# Patient Record
Sex: Female | Born: 1940 | Race: Black or African American | Hispanic: No | Marital: Married
Health system: Southern US, Community
[De-identification: ages and names within clinical notes are randomized; demographics above are authoritative.]

---

## 2005-10-30 ENCOUNTER — Emergency Department (HOSPITAL_COMMUNITY): Admission: EM | Admit: 2005-10-30 | Discharge: 2005-10-30 | Payer: Self-pay | Admitting: Emergency Medicine

## 2006-06-19 ENCOUNTER — Emergency Department (HOSPITAL_COMMUNITY): Admission: EM | Admit: 2006-06-19 | Discharge: 2006-06-19 | Payer: Self-pay | Admitting: Emergency Medicine

## 2010-07-29 ENCOUNTER — Encounter: Admission: RE | Admit: 2010-07-29 | Discharge: 2010-07-29 | Payer: Self-pay | Admitting: Internal Medicine

## 2011-10-11 ENCOUNTER — Emergency Department (HOSPITAL_COMMUNITY): Payer: Medicare Other

## 2011-10-11 ENCOUNTER — Emergency Department (HOSPITAL_COMMUNITY)
Admission: EM | Admit: 2011-10-11 | Discharge: 2011-10-11 | Disposition: A | Discharge status: Home / Self Care | Payer: Medicare Other | Attending: Emergency Medicine | Admitting: Emergency Medicine

## 2011-10-11 DIAGNOSIS — I10 Essential (primary) hypertension: Secondary | ICD-10-CM | POA: Insufficient documentation

## 2011-10-11 DIAGNOSIS — Z79899 Other long term (current) drug therapy: Secondary | ICD-10-CM | POA: Insufficient documentation

## 2011-10-11 DIAGNOSIS — R609 Edema, unspecified: Secondary | ICD-10-CM | POA: Insufficient documentation

## 2011-10-11 DIAGNOSIS — R1011 Right upper quadrant pain: Secondary | ICD-10-CM | POA: Insufficient documentation

## 2011-10-11 DIAGNOSIS — R071 Chest pain on breathing: Secondary | ICD-10-CM | POA: Insufficient documentation

## 2011-10-11 LAB — POCT I-STAT TROPONIN I: Troponin i, poc: 0 ng/mL (ref 0.00–0.08)

## 2011-10-11 LAB — DIFFERENTIAL
Basophils Absolute: 0 10*3/uL (ref 0.0–0.1)
Basophils Relative: 0 % (ref 0–1)
Eosinophils Absolute: 0 10*3/uL (ref 0.0–0.7)
Eosinophils Relative: 1 % (ref 0–5)
Lymphocytes Relative: 45 % (ref 12–46)
Lymphs Abs: 2.6 10*3/uL (ref 0.7–4.0)
Monocytes Absolute: 0.3 10*3/uL (ref 0.1–1.0)
Monocytes Relative: 5 % (ref 3–12)
Neutro Abs: 2.9 10*3/uL (ref 1.7–7.7)
Neutrophils Relative %: 49 % (ref 43–77)

## 2011-10-11 LAB — CBC
HCT: 38.2 % (ref 36.0–46.0)
Hemoglobin: 13.1 g/dL (ref 12.0–15.0)
MCH: 31 pg (ref 26.0–34.0)
MCHC: 34.3 g/dL (ref 30.0–36.0)
MCV: 90.5 fL (ref 78.0–100.0)
Platelets: 295 10*3/uL (ref 150–400)
RBC: 4.22 MIL/uL (ref 3.87–5.11)
RDW: 12.5 % (ref 11.5–15.5)
WBC: 5.8 10*3/uL (ref 4.0–10.5)

## 2011-10-11 LAB — BASIC METABOLIC PANEL
BUN: 14 mg/dL (ref 6–23)
CO2: 30 mEq/L (ref 19–32)
Calcium: 9.7 mg/dL (ref 8.4–10.5)
Chloride: 103 mEq/L (ref 96–112)
Creatinine, Ser: 0.6 mg/dL (ref 0.50–1.10)
GFR calc Af Amer: >90 mL/min (ref >90)
GFR calc non Af Amer: 89 mL/min — ABNORMAL LOW (ref >90)
Glucose, Bld: 106 mg/dL — ABNORMAL HIGH (ref 70–99)
Potassium: 3.7 mEq/L (ref 3.5–5.1)
Sodium: 143 mEq/L (ref 135–145)

## 2011-10-11 LAB — CK TOTAL AND CKMB (NOT AT ARMC)
CK, MB: 2.5 ng/mL (ref 0.3–4.0)
Relative Index: 2.1 (ref 0.0–2.5)
Total CK: 117 U/L (ref 7–177)

## 2012-06-11 IMAGING — CR DG CHEST 2V
2 series · 2 of 2 positions shown · non-contrast
Comparison: None.

CLINICAL DATA: Right chest pain

CHEST - 2 VIEW

[w chest pa]
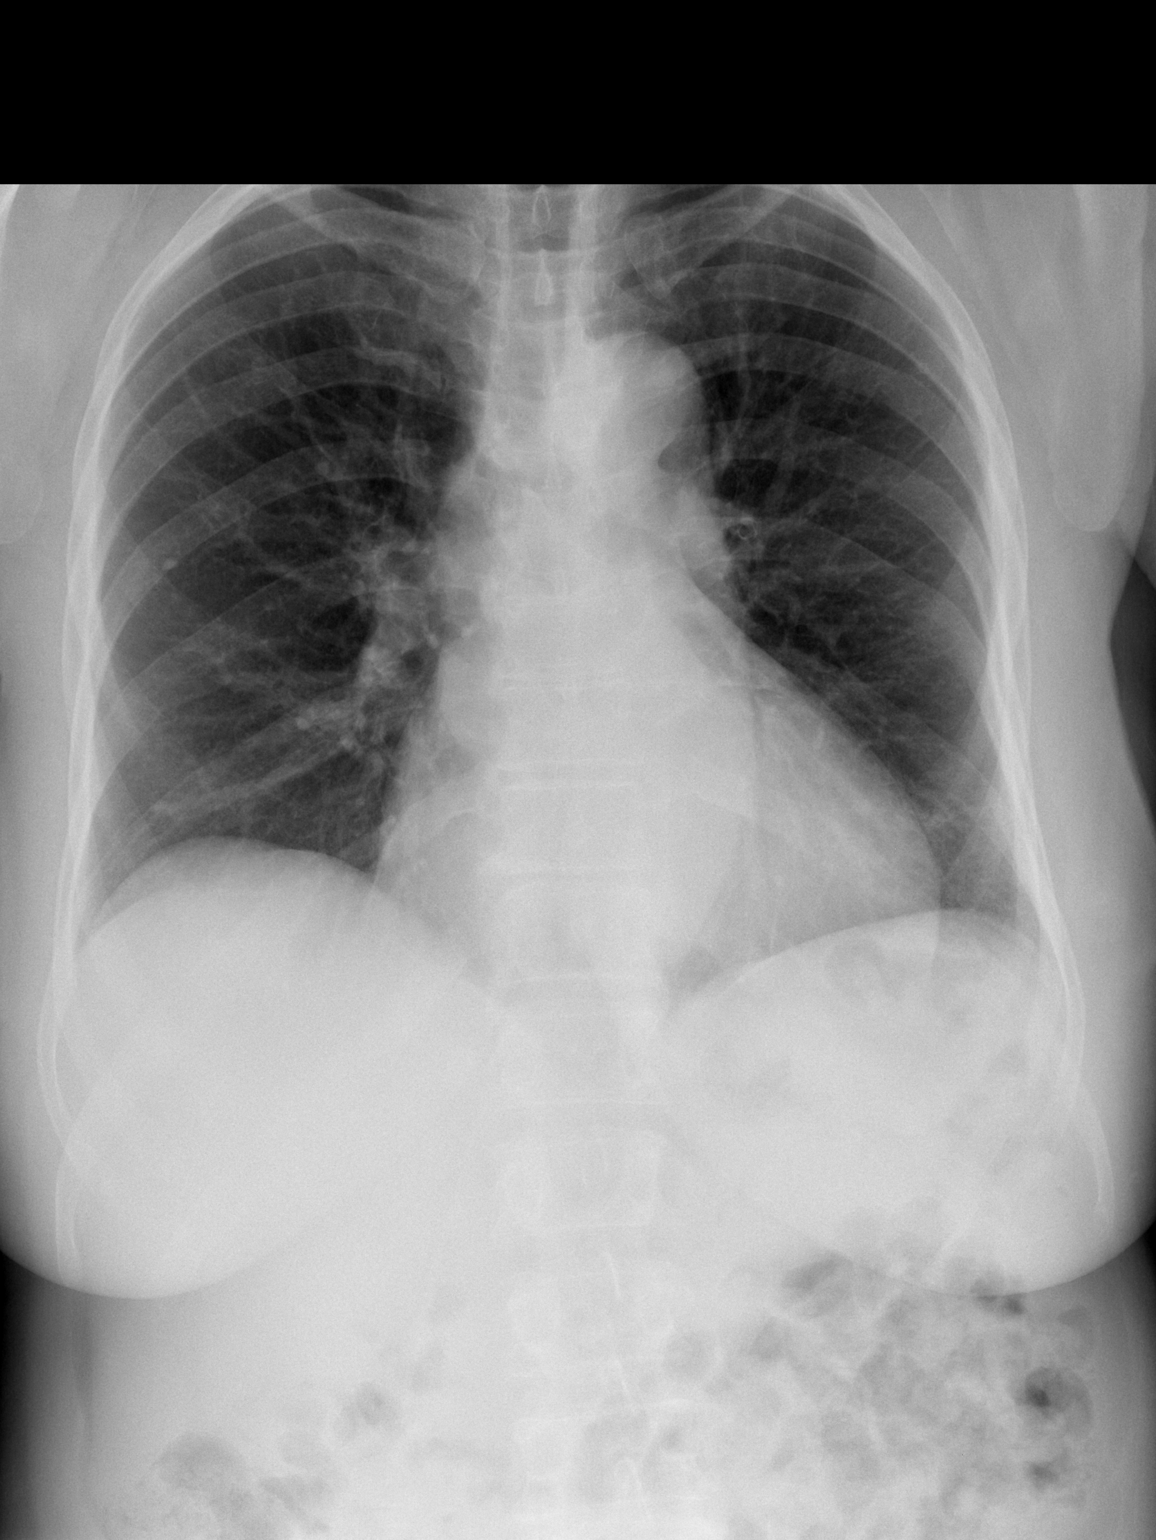

[w chest lat]
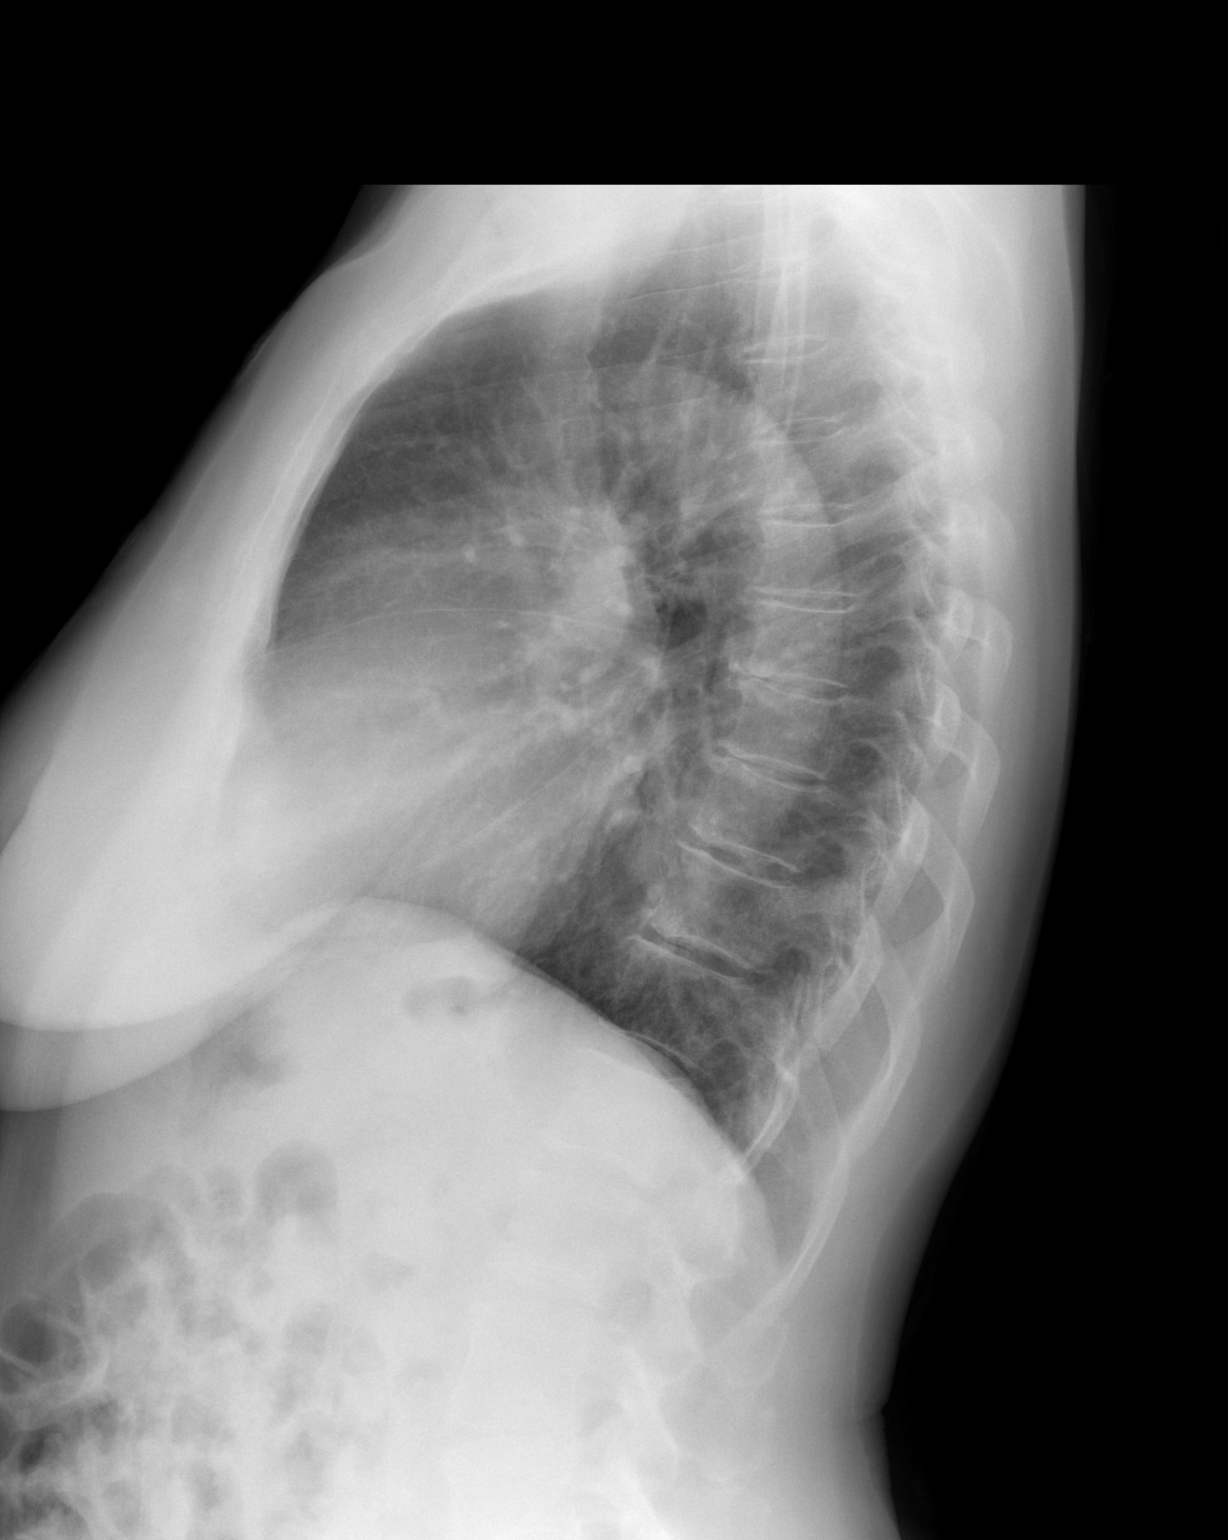

[2 of 2 positions shown; findings below may reference images not displayed]

FINDINGS: The cardiopericardial silhouette is enlarged. The lungs
are clear without focal infiltrate, edema, pneumothorax or pleural
effusion.  Tiny nodule in the right midlung is probably a granuloma
given its conspicuity relative to its small size. Imaged bony
structures of the thorax are intact.
IMPRESSION: Cardiomegaly without edema or focal pneumonia.

Probable granuloma in the right midlung.  If the patient has
previous chest x-rays, comparison to those studies to assess
chronicity would prove helpful.  Otherwise consider follow-up two-
view chest x-ray in 3 months to confirm stability.

## 2020-05-11 ENCOUNTER — Ambulatory Visit (INDEPENDENT_AMBULATORY_CARE_PROVIDER_SITE_OTHER): Payer: Medicare Other | Admitting: Podiatry

## 2020-05-11 ENCOUNTER — Other Ambulatory Visit: Payer: Self-pay

## 2020-05-11 ENCOUNTER — Ambulatory Visit (INDEPENDENT_AMBULATORY_CARE_PROVIDER_SITE_OTHER): Payer: Medicare Other

## 2020-05-11 DIAGNOSIS — M7751 Other enthesopathy of right foot: Secondary | ICD-10-CM

## 2020-05-11 DIAGNOSIS — M79671 Pain in right foot: Secondary | ICD-10-CM | POA: Diagnosis not present

## 2020-05-11 DIAGNOSIS — M79672 Pain in left foot: Secondary | ICD-10-CM | POA: Diagnosis not present

## 2020-05-11 DIAGNOSIS — M778 Other enthesopathies, not elsewhere classified: Secondary | ICD-10-CM | POA: Diagnosis not present

## 2020-05-11 DIAGNOSIS — M779 Enthesopathy, unspecified: Secondary | ICD-10-CM

## 2020-05-14 NOTE — Progress Notes (Signed)
Subjective:   Patient ID: Wanda Knox, female   DOB: 79 y.o.   MRN: [id number]   HPI Patient presents stating she is getting pain in the outside of her ankle and she does not remember specific injury.  States it is been hurting for a couple months and she does have some veins which can get swollen but she does not think it is contributory.  Patient does not smoke likes to be active   Review of Systems  All other systems reviewed and are negative.       Objective:  Physical Exam Vitals and nursing note reviewed.  Constitutional:      Appearance: She is well-developed.  Pulmonary:     Effort: Pulmonary effort is normal.  Musculoskeletal:        General: Normal range of motion.  Skin:    General: Skin is warm.  Neurological:     Mental Status: She is alert.     Neurovascular status intact muscle strength adequate range of motion moderately diminished on the right side where she is splinting but no restrictions or crepitus noted.  Patient has good digital perfusion well oriented x3 and was noted to have exquisite discomfort in the right sinus tarsi     Assessment:  Inflammatory capsulitis of the right sinus tarsi with inflammation fluid buildup     Plan:  H&P condition reviewed.  Today I went ahead did sterile prep and injected the sinus tarsi right 3 mg Kenalog 5 mg Xylocaine and advised on supportive therapy and rigid shoes along with oral anti-inflammatories topical medication.  Patient will be seen back on an as-needed basis  X-rays were negative for signs of advanced arthritis or indications of stress fracture

## 2020-05-17 ENCOUNTER — Telehealth: Payer: Self-pay | Admitting: Podiatry

## 2020-05-17 NOTE — Telephone Encounter (Signed)
Pt called and states she received injection in rt foot on 05/11/20 and is having pain and would like to know if she can get something for the pain.

## 2020-05-17 NOTE — Telephone Encounter (Signed)
I spoke with Wanda Knox and she states she had started her exercise walking again, and the foot hurts. I told Wanda Knox to back off on the amount of walking and how strenuous, rest, and ice protecting the skin with a light cloth, and reappt. I told Wanda Knox is would inform Dr. Charlsie Merles of her discomfort and no relief with OTC medications.

## 2020-05-29 ENCOUNTER — Telehealth: Payer: Self-pay | Admitting: Podiatry

## 2020-05-29 NOTE — Telephone Encounter (Signed)
I have been trying to call this patient for the last two weeks to schedule an appointment. I am unable to lvm.

## 2020-07-19 ENCOUNTER — Other Ambulatory Visit: Payer: Self-pay | Admitting: Podiatry

## 2020-07-19 DIAGNOSIS — M778 Other enthesopathies, not elsewhere classified: Secondary | ICD-10-CM
